# Patient Record
Sex: Male | Born: 1990 | Race: White | Hispanic: No | Marital: Married | Smoking: Former smoker
Health system: Southern US, Community
[De-identification: ages and names within clinical notes are randomized; demographics above are authoritative.]

## PROBLEM LIST (undated history)

## (undated) DIAGNOSIS — G43909 Migraine, unspecified, not intractable, without status migrainosus: Secondary | ICD-10-CM

## (undated) HISTORY — DX: Migraine, unspecified, not intractable, without status migrainosus: G43.909

---

## 2011-09-20 ENCOUNTER — Other Ambulatory Visit (HOSPITAL_COMMUNITY): Payer: Self-pay | Admitting: *Deleted

## 2011-09-20 DIAGNOSIS — S42413A Displaced simple supracondylar fracture without intercondylar fracture of unspecified humerus, initial encounter for closed fracture: Secondary | ICD-10-CM

## 2011-10-03 ENCOUNTER — Other Ambulatory Visit (HOSPITAL_COMMUNITY): Payer: Self-pay

## 2011-10-03 ENCOUNTER — Ambulatory Visit (HOSPITAL_COMMUNITY)
Admission: RE | Admit: 2011-10-03 | Discharge: 2011-10-03 | Disposition: A | Discharge status: Home / Self Care | Payer: BC Managed Care – PPO | Source: Ambulatory Visit | Attending: *Deleted | Admitting: *Deleted

## 2011-10-03 ENCOUNTER — Ambulatory Visit (HOSPITAL_COMMUNITY)
Admission: RE | Admit: 2011-10-03 | Discharge: 2011-10-03 | Disposition: A | Discharge status: Home / Self Care | Payer: BC Managed Care – PPO | Source: Ambulatory Visit | Attending: Orthopedic Surgery | Admitting: Orthopedic Surgery

## 2011-10-03 DIAGNOSIS — M249 Joint derangement, unspecified: Secondary | ICD-10-CM | POA: Insufficient documentation

## 2011-10-03 DIAGNOSIS — M25529 Pain in unspecified elbow: Secondary | ICD-10-CM | POA: Insufficient documentation

## 2011-10-03 DIAGNOSIS — S42413A Displaced simple supracondylar fracture without intercondylar fracture of unspecified humerus, initial encounter for closed fracture: Secondary | ICD-10-CM

## 2011-10-03 MED ORDER — IOHEXOL 180 MG/ML  SOLN
7.5000 mL | Freq: Once | INTRAMUSCULAR | Status: AC | PRN
  Administered 2011-10-03: 7.5 mL via INTRAVENOUS

## 2013-10-23 IMAGING — RF DG FLUORO GUIDE NDL PLC/BX
3 series · 3 of 3 positions shown · IV contrast (omnipaque)
Comparison: none

CLINICAL DATA: History of right elbow trauma with subsequent
operative fixation.  Patient with elbow pain and locking.

RIGHT ELBOW INJECTION UNDER FLUOROSCOPY
The procedure, risks, benefits and alternatives were explained to
the patient.  Questions regarding the procedure were encouraged and
answered.  Written consent was obtained.
TECHNIQUE: An appropriate skin entrance site was determined under
fluoroscopy.  The site was marked, prepped with Betadine, draped in
the usual sterile fashion.  Lidocaine was used for local
anesthesia.  Subsequently, a needle was advanced into the joint
between radial head and capitellum using a lateral approach under
intermittent fluoroscopy.  Approximately 10cc of a combination of
Omnipaque 180 and 1% xylocaine was injected. Fluoroscopy confirms
intra-articular injection.
Fluoroscopy Time: 0.07 minutes.

[Series 1: run · 1 of 1 slices shown (1 of 3)]
[im 1/1]
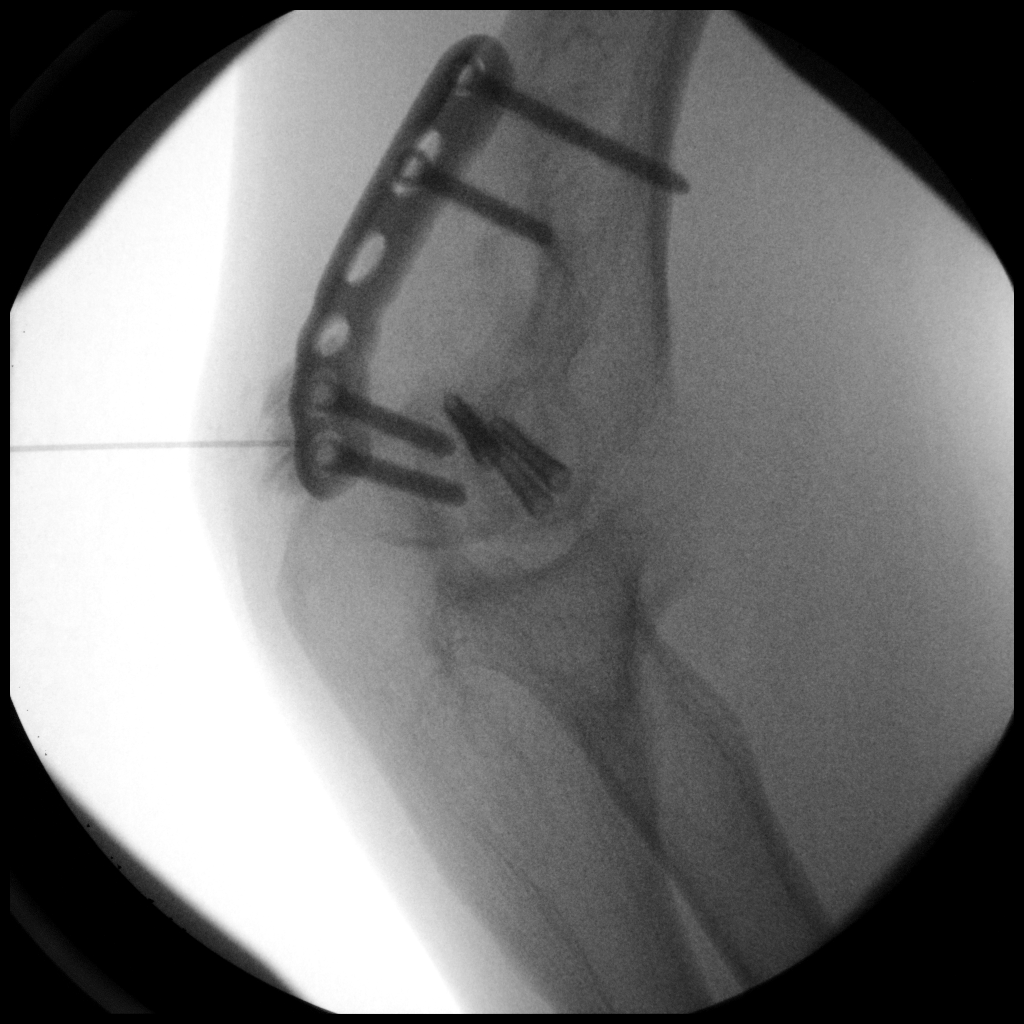

[Series 2: run · 1 of 1 slices shown (2 of 3)]
[im 1/1]
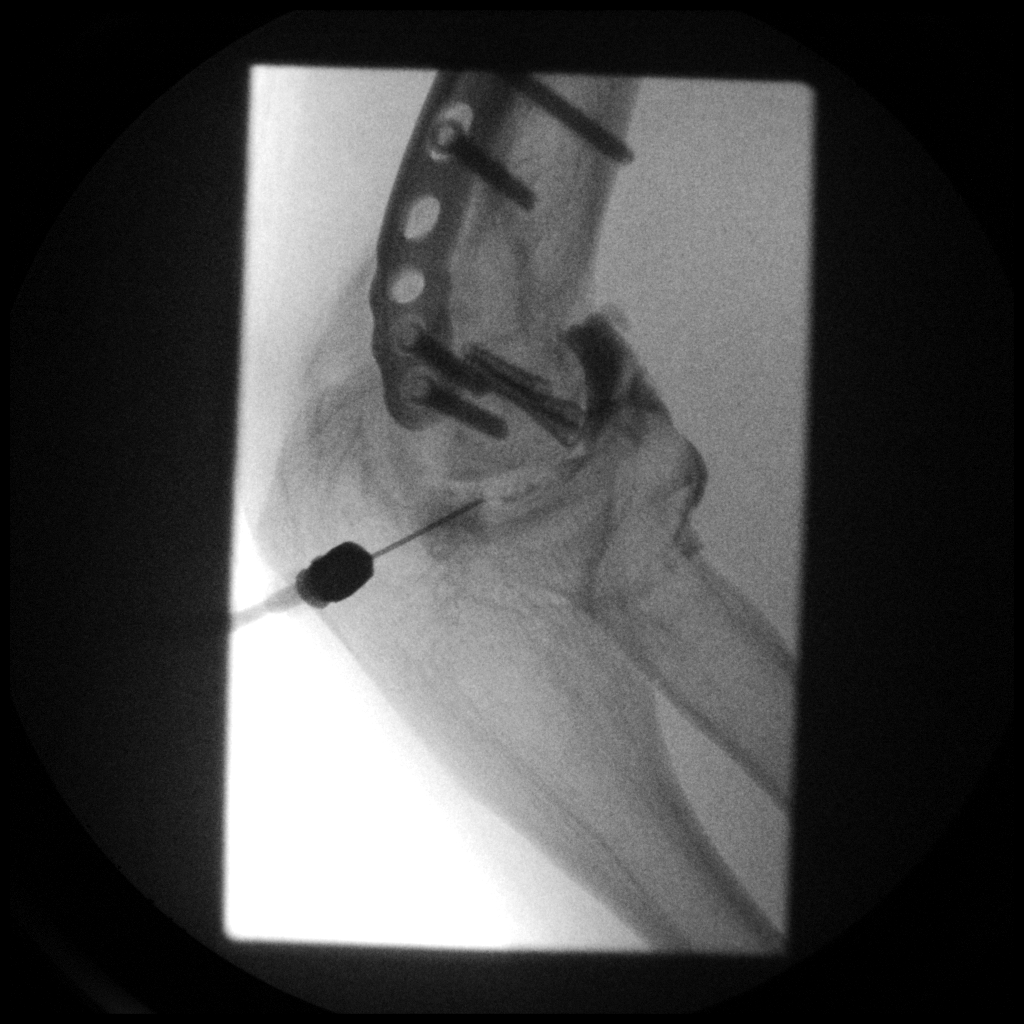

[Series 3: run · 1 of 1 slices shown (3 of 3)]
[im 1/1]
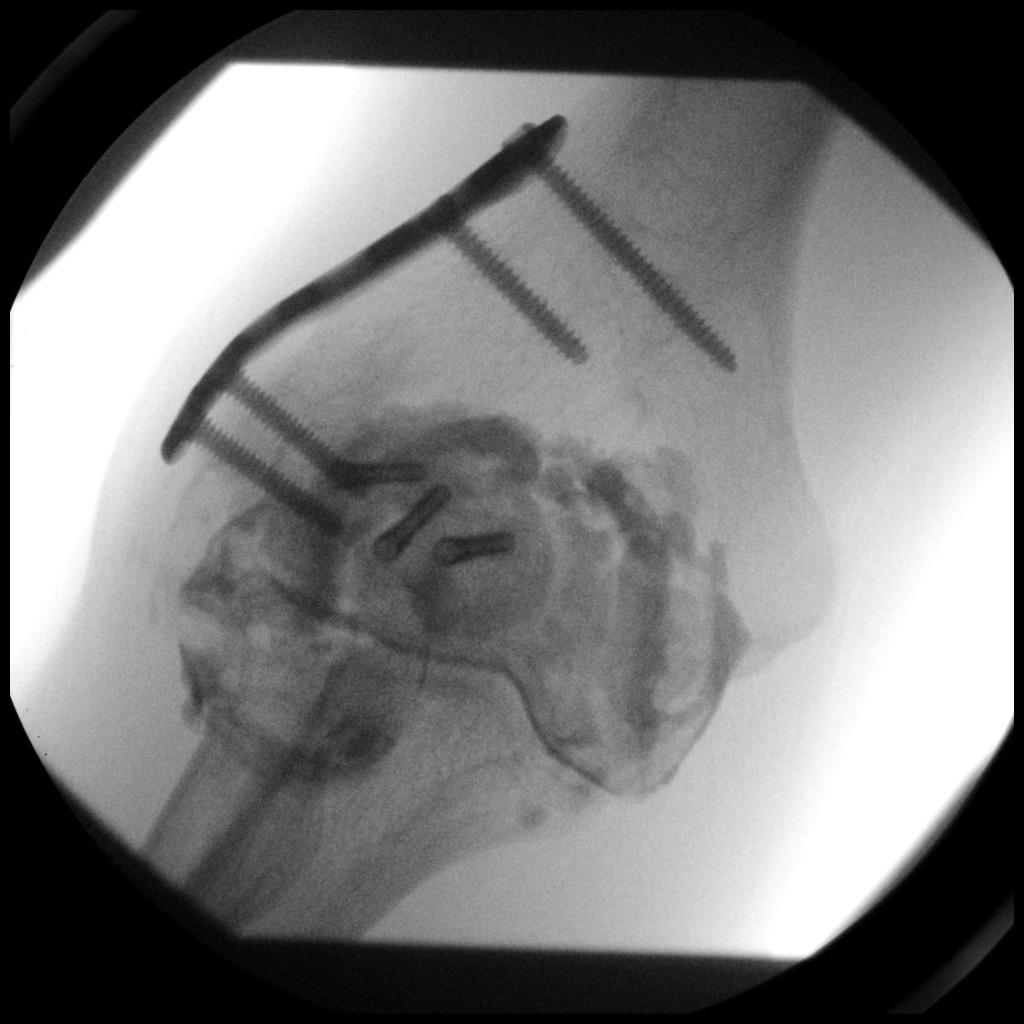

[3 of 3 positions shown; findings below may reference images not displayed]

IMPRESSION: Technically successful right elbow injection for subsequent CT
scan.

## 2013-10-23 IMAGING — CT CT ELBOW*R* W/CM
3 of 4 series · 16 of 33 positions shown, 18 images · IV contrast (agent unspecified)
Comparison: None

CLINICAL DATA: Prior trauma and fracture fixation.  Intermittent
pain and locking.

CT RIGHT ELBOW WITH CONTRAST
TECHNIQUE: Multidetector CT imaging of the right elbow was
performed according to the standard protocol following intra-
articular contrast administration. Multiplanar CT image
reconstructions were also generated.
Contrast:  See injection dictation for contrast amount.

[Series 4: extremityelbow 2.0 b31 · axial · 0.40mm/px · z∈[+31,+213]mm · 8 of 109 slices shown, 10 images]
[im 9/109  soft-tissue]
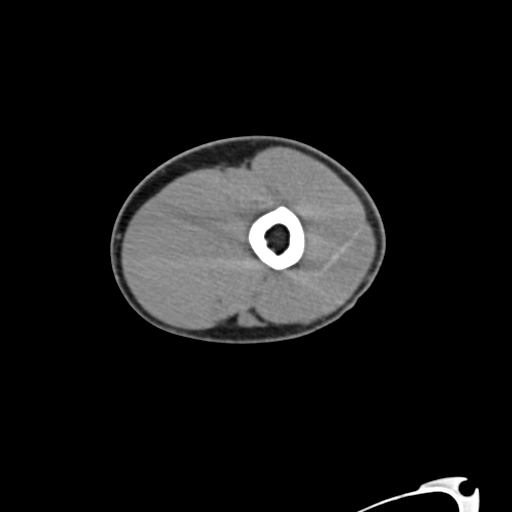
[im 9/109  bone]
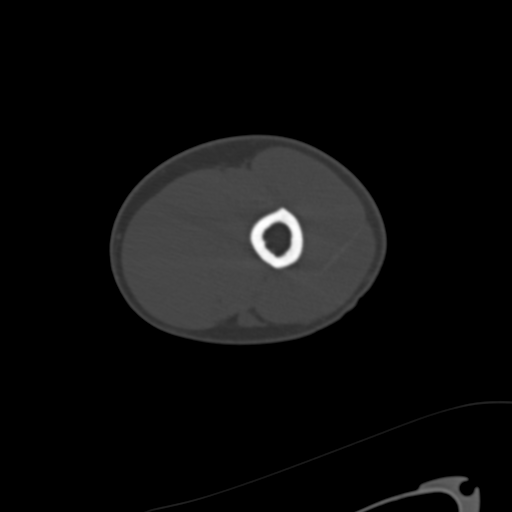
[im 25/109  bone]
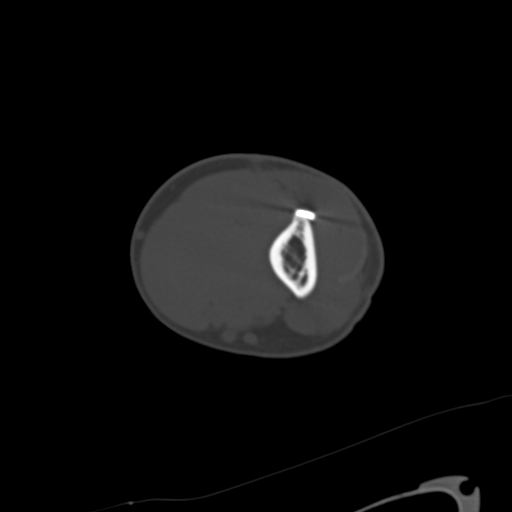
[im 34/109  bone]
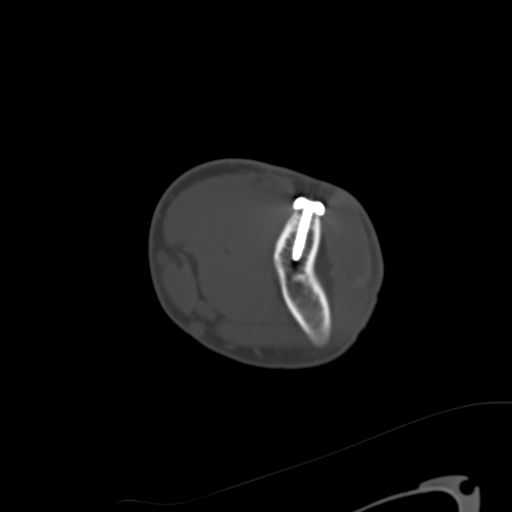
[im 50/109  bone]
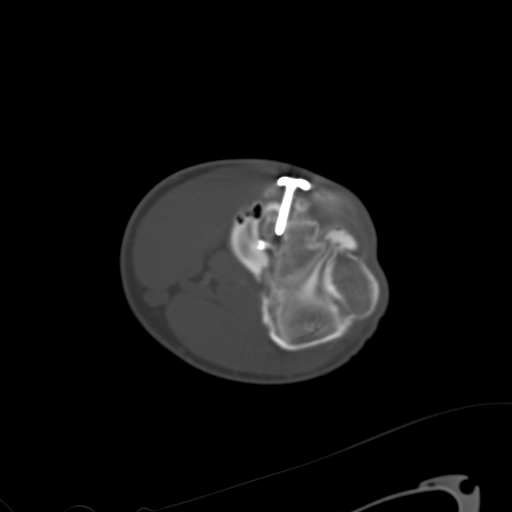
[im 59/109  soft-tissue]
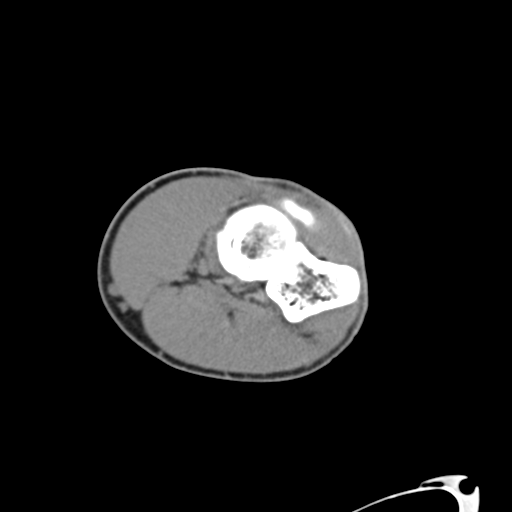
[im 59/109  bone]
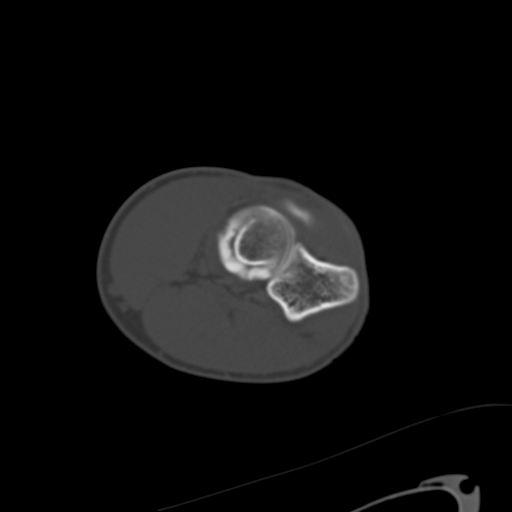
[im 75/109  bone]
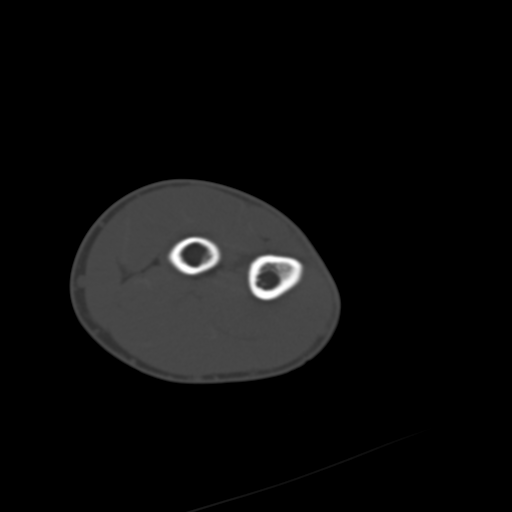
[im 84/109  bone]
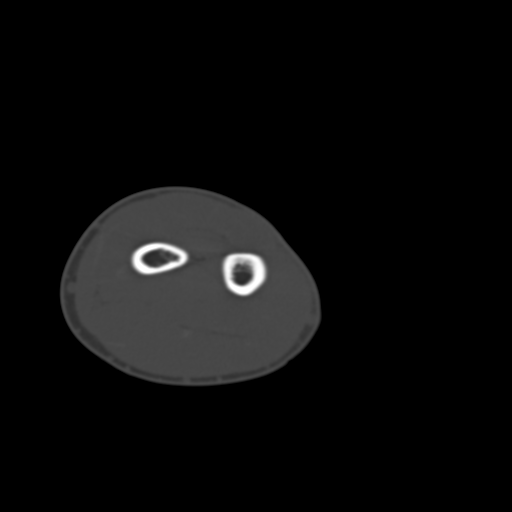
[im 100/109  bone]
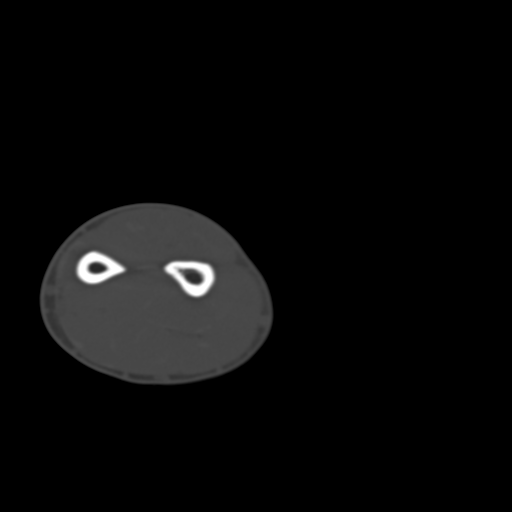

[Series 602: st cor · coronal · 0.43mm/px · 3 of 39 slices shown]
[im 8/39  bone]
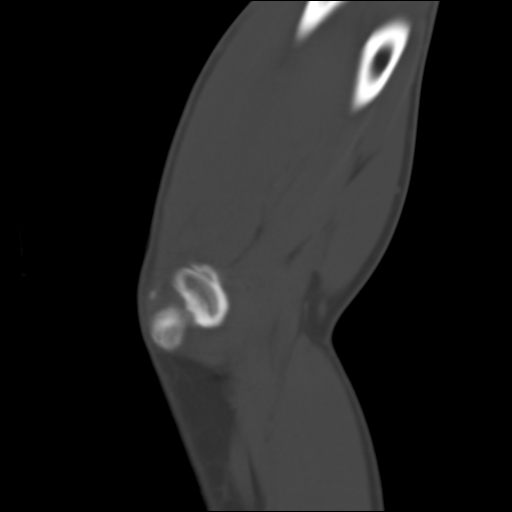
[im 16/39  bone]
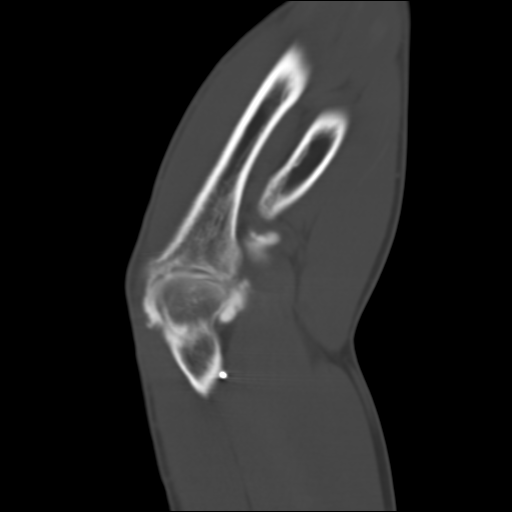
[im 23/39  bone]
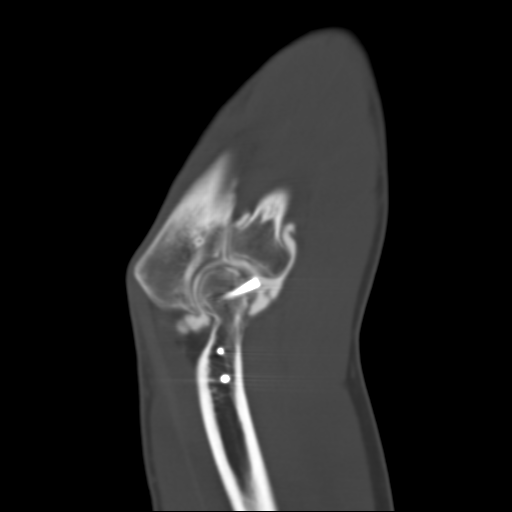

[Series 605: bone sag · sagittal · 0.43mm/px · 5 of 47 slices shown]
[im 7/47  bone]
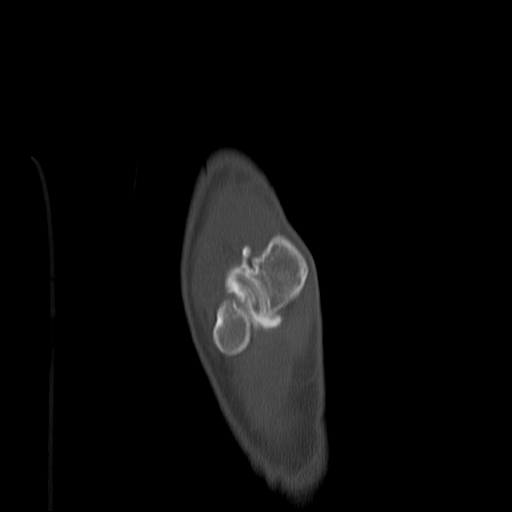
[im 14/47  bone]
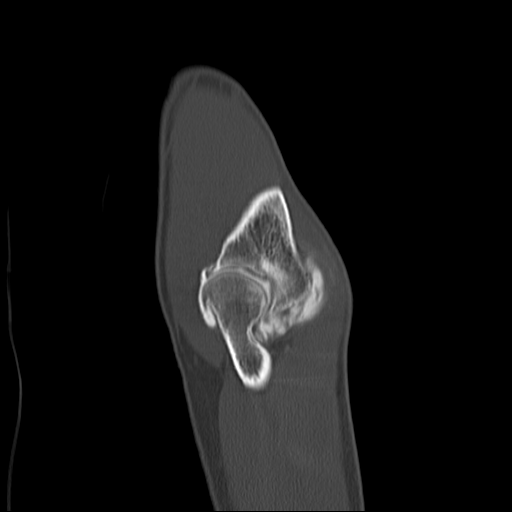
[im 20/47  bone]
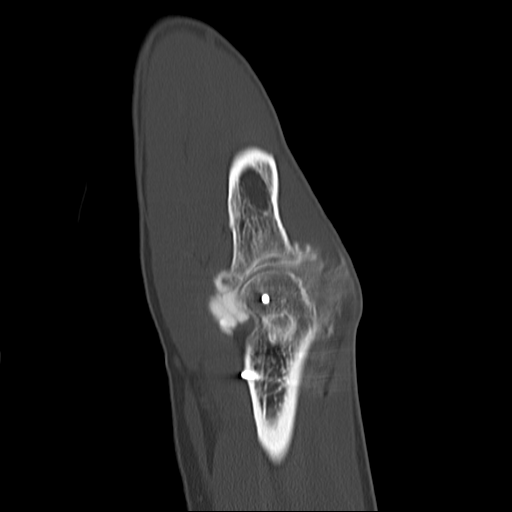
[im 27/47  bone]
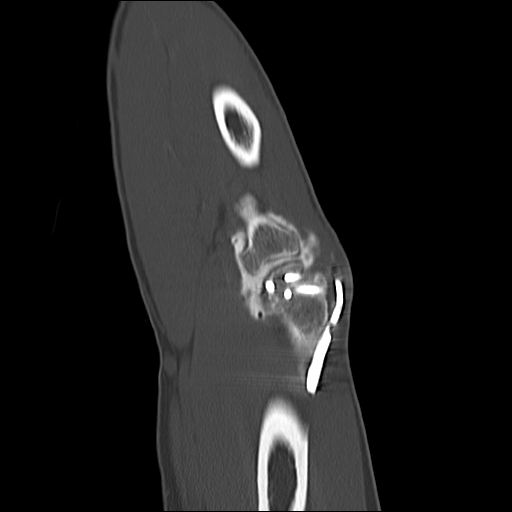
[im 33/47  bone]
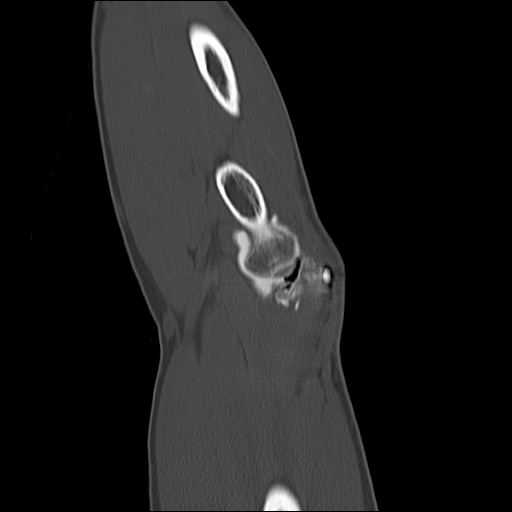

[16 of 33 positions shown; findings below may reference images not displayed]

FINDINGS: There is a lateral side plate on the distal humerus with
a healed distal humerus fracture.  The most inferior screw
demonstrates slight lucency around it suggesting loosening.

There are three bone anchors in the capitellum likely related to an
osteochondral fracture fixation.  The medial and middle bone
anchors are projecting into the radiocapitellar joint. The middle
anchor head abuts the articular cartilage of the radial head.

There are multiple small fracture fragments and areas of probable
heterotopic ossification around the elbow both medially and
laterally but no loose intra-articular bodies are demonstrated.

There are mild post-traumatic degenerative changes with areas of
spurring.
IMPRESSION: 1.  Lucency surrounding the lower most humeral screw consistent
with loosening.
2.  The medial and middle capitellar bone anchors are protruding
into the radiocapitellar joint (middle most pronounced).
3.  Mild post-traumatic degenerative changes.
4.  Multiple small loose fracture fragments and areas of
heterotopic ossification but no definite loose intra-articular
bodies.

## 2017-12-06 ENCOUNTER — Encounter: Payer: Self-pay | Admitting: Family Medicine

## 2017-12-06 ENCOUNTER — Ambulatory Visit: Payer: Commercial Managed Care - PPO | Admitting: Family Medicine

## 2017-12-06 VITALS — BP 112/80 | HR 66 | Temp 98.6°F | Ht 70.0 in | Wt 197.2 lb

## 2017-12-06 DIAGNOSIS — Z23 Encounter for immunization: Secondary | ICD-10-CM | POA: Diagnosis not present

## 2017-12-06 DIAGNOSIS — L989 Disorder of the skin and subcutaneous tissue, unspecified: Secondary | ICD-10-CM | POA: Insufficient documentation

## 2017-12-06 DIAGNOSIS — G43109 Migraine with aura, not intractable, without status migrainosus: Secondary | ICD-10-CM | POA: Insufficient documentation

## 2017-12-06 NOTE — Progress Notes (Signed)
Chief Complaint  Patient presents with  . New Patient (Initial Visit)       New Patient Visit SUBJECTIVE: HPI: Jack Allen is an 27 y.o.male who is being seen for establishing care.  The patient has not previously had a PCP.  Patient has a history of migraines.  Around 1 month ago, the patient had his first migraine in 10 years.  He gets an aura consisting of right arm numbness/tingling.  Because he could not get in here quickly, he ended up going to a neurologist who refilled his nasal actuation Imitrex.  He has not had a migraine since then and does not currently have a headache.  The day he had his headache, he had 6 diet colas and was particularly stressed.  He thinks this contributed to his migraine.  He also has a history of skin cancer on his L foot.  This was removed and managed in the past.  He noticed some lesions on his right forearm several weeks ago.  He does not think they are changing and are less dark than his initial skin cancer.  There is also a lesion on his back that someone at the urgent care told him to get checked out.  Both he and his wife were unaware of this.  He did have significant sun exposure when he was younger.  No Known Allergies  Past Medical History:  Diagnosis Date  . Migraine    History reviewed. No pertinent surgical history. Family History  Problem Relation Age of Onset  . Hyperlipidemia Father   . Hypertension Father    No Known Allergies  Current Outpatient Medications:  .  SUMAtriptan (IMITREX) 20 MG/ACT nasal spray, Place 20 mg into the nose every 2 (two) hours as needed for migraine or headache. May repeat in 2 hours if headache persists or recurs., Disp: , Rfl:    ROS Skin: As noted in HPI  Neuro: Denies current HA   OBJECTIVE: BP 112/80 (BP Location: Left Arm, Patient Position: Sitting, Cuff Size: Normal)   Pulse 66   Temp 98.6 F (37 C) (Oral)   Ht 5\' 10"  (1.778 m)   Wt 197 lb 4 oz (89.5 kg)   SpO2 98%   BMI 28.30 kg/m     Constitutional: -  VS reviewed -  Well developed, well nourished, appears stated age -  No apparent distress  Psychiatric: -  Oriented to person, place, and time -  Memory intact -  Affect and mood normal -  Fluent conversation, good eye contact -  Judgment and insight age appropriate  Eye: -  Conjunctivae clear, no discharge -  Pupils symmetric, round, reactive to light  ENMT: -  MMM    Pharynx moist, no exudate, no erythema  Neck: -  No gross swelling, no palpable masses -  Thyroid midline, not enlarged, mobile, no palpable masses  Cardiovascular: -  RRR -  No LE edema  Respiratory: -  Normal respiratory effort, no accessory muscle use, no retraction -  Breath sounds equal, no wheezes, no ronchi, no crackles  Neurological:  -  CN II - XII grossly intact -  DTR's equal and symmetric throughout -  Sensation grossly intact to light touch, equal bilaterally  Musculoskeletal: -  No clubbing, no cyanosis -  Gait normal  Skin: -  See below -  Warm and dry to palpation    R upper back    R dorsal forearm   ASSESSMENT/PLAN: Migraine with aura and without status  migrainosus, not intractable  Skin lesion  Need for tetanus booster - Plan: Tdap vaccine greater than or equal to 7yo IM  Okay to refill Imitrex when he needs more.  He does not need to see neurology unless he wishes.  He knows what his triggers are. For the skin lesions, will monitor.  Likely an atypical nevus on the back, the lesions in the forearm appear benign.  If there are any changes, he will let us know and we will remove them to be sent to the lab. Patient should return in 6 months. The patient voiced understanding and agreement to the plan.   Jilda Roche Traver, DO 12/06/17  12:23 PM

## 2017-12-06 NOTE — Progress Notes (Signed)
Pre visit review using our clinic review tool, if applicable. No additional management support is needed unless otherwise documented below in the visit note. 

## 2017-12-06 NOTE — Patient Instructions (Addendum)
Let me know if anything changes with the skin areas. Take a picture of them both. Have your wife keep an eye on the area on your back (right upper area).  Let me know when you need refills of the nasal spray.  Let us know if you need anything.

## 2018-02-20 DIAGNOSIS — M5136 Other intervertebral disc degeneration, lumbar region: Secondary | ICD-10-CM | POA: Diagnosis not present

## 2018-02-20 DIAGNOSIS — M5137 Other intervertebral disc degeneration, lumbosacral region: Secondary | ICD-10-CM | POA: Diagnosis not present

## 2018-02-20 DIAGNOSIS — M5431 Sciatica, right side: Secondary | ICD-10-CM | POA: Diagnosis not present

## 2018-03-01 DIAGNOSIS — G43109 Migraine with aura, not intractable, without status migrainosus: Secondary | ICD-10-CM | POA: Diagnosis not present

## 2018-03-20 DIAGNOSIS — M5137 Other intervertebral disc degeneration, lumbosacral region: Secondary | ICD-10-CM | POA: Diagnosis not present

## 2018-03-20 DIAGNOSIS — M5136 Other intervertebral disc degeneration, lumbar region: Secondary | ICD-10-CM | POA: Diagnosis not present

## 2018-03-20 DIAGNOSIS — M5431 Sciatica, right side: Secondary | ICD-10-CM | POA: Diagnosis not present

## 2018-04-02 DIAGNOSIS — M545 Low back pain: Secondary | ICD-10-CM | POA: Diagnosis not present

## 2018-04-05 ENCOUNTER — Ambulatory Visit: Payer: Commercial Managed Care - PPO | Admitting: Family Medicine

## 2018-04-06 ENCOUNTER — Encounter: Payer: Self-pay | Admitting: Family Medicine

## 2018-04-06 ENCOUNTER — Ambulatory Visit: Payer: Commercial Managed Care - PPO | Admitting: Family Medicine

## 2018-04-06 VITALS — BP 128/86 | HR 65 | Temp 98.3°F | Ht 70.0 in | Wt 209.1 lb

## 2018-04-06 DIAGNOSIS — M545 Low back pain, unspecified: Secondary | ICD-10-CM

## 2018-04-06 MED ORDER — MELOXICAM 15 MG PO TABS: 15.0000 mg | ORAL_TABLET | Freq: Every day | ORAL | 0 refills | Status: DC | PRN

## 2018-04-06 NOTE — Patient Instructions (Signed)
Send me a message in 3-4 weeks if no improvement with the stretches/exercises.  Heat (pad or rice pillow in microwave) over affected area, 10-15 minutes twice daily.   OK to take Tylenol 1000 mg (2 extra strength tabs) or 975 mg (3 regular strength tabs) every 6 hours as needed.  EXERCISES  RANGE OF MOTION (ROM) AND STRETCHING EXERCISES - Low Back Pain Most people with lower back pain will find that their symptoms get worse with excessive bending forward (flexion) or arching at the lower back (extension). The exercises that will help resolve your symptoms will focus on the opposite motion.  If you have pain, numbness or tingling which travels down into your buttocks, leg or foot, the goal of the therapy is for these symptoms to move closer to your back and eventually resolve. Sometimes, these leg symptoms will get better, but your lower back pain may worsen. This is often an indication of progress in your rehabilitation. Be very alert to any changes in your symptoms and the activities in which you participated in the 24 hours prior to the change. Sharing this information with your caregiver will allow him or her to most efficiently treat your condition. These exercises may help you when beginning to rehabilitate your injury. Your symptoms may resolve with or without further involvement from your physician, physical therapist or athletic trainer. While completing these exercises, remember:   Restoring tissue flexibility helps normal motion to return to the joints. This allows healthier, less painful movement and activity.  An effective stretch should be held for at least 30 seconds.  A stretch should never be painful. You should only feel a gentle lengthening or release in the stretched tissue. FLEXION RANGE OF MOTION AND STRETCHING EXERCISES:  STRETCH - Flexion, Single Knee to Chest   Lie on a firm bed or floor with both legs extended in front of you.  Keeping one leg in contact with the  floor, bring your opposite knee to your chest. Hold your leg in place by either grabbing behind your thigh or at your knee.  Pull until you feel a gentle stretch in your low back. Hold 30 seconds.  Slowly release your grasp and repeat the exercise with the opposite side. Repeat 2 times. Complete this exercise 3 times per week.   STRETCH - Flexion, Double Knee to Chest  Lie on a firm bed or floor with both legs extended in front of you.  Keeping one leg in contact with the floor, bring your opposite knee to your chest.  Tense your stomach muscles to support your back and then lift your other knee to your chest. Hold your legs in place by either grabbing behind your thighs or at your knees.  Pull both knees toward your chest until you feel a gentle stretch in your low back. Hold 30 seconds.  Tense your stomach muscles and slowly return one leg at a time to the floor. Repeat 2 times. Complete this exercise 3 times per week.   STRETCH - Low Trunk Rotation  Lie on a firm bed or floor. Keeping your legs in front of you, bend your knees so they are both pointed toward the ceiling and your feet are flat on the floor.  Extend your arms out to the side. This will stabilize your upper body by keeping your shoulders in contact with the floor.  Gently and slowly drop both knees together to one side until you feel a gentle stretch in your low back. Hold for  30 seconds.  Tense your stomach muscles to support your lower back as you bring your knees back to the starting position. Repeat the exercise to the other side. Repeat 2 times. Complete this exercise at least 3 times per week.   EXTENSION RANGE OF MOTION AND FLEXIBILITY EXERCISES:  STRETCH - Extension, Prone on Elbows   Lie on your stomach on the floor, a bed will be too soft. Place your palms about shoulder width apart and at the height of your head.  Place your elbows under your shoulders. If this is too painful, stack pillows under your  chest.  Allow your body to relax so that your hips drop lower and make contact more completely with the floor.  Hold this position for 30 seconds.  Slowly return to lying flat on the floor. Repeat 2 times. Complete this exercise 3 times per week.   RANGE OF MOTION - Extension, Prone Press Ups  Lie on your stomach on the floor, a bed will be too soft. Place your palms about shoulder width apart and at the height of your head.  Keeping your back as relaxed as possible, slowly straighten your elbows while keeping your hips on the floor. You may adjust the placement of your hands to maximize your comfort. As you gain motion, your hands will come more underneath your shoulders.  Hold this position 30 seconds.  Slowly return to lying flat on the floor. Repeat 2 times. Complete this exercise 3 times per week.   RANGE OF MOTION- Quadruped, Neutral Spine   Assume a hands and knees position on a firm surface. Keep your hands under your shoulders and your knees under your hips. You may place padding under your knees for comfort.  Drop your head and point your tailbone toward the ground below you. This will round out your lower back like an angry cat. Hold this position for 30 seconds.  Slowly lift your head and release your tail bone so that your back sags into a large arch, like an old horse.  Hold this position for 30 seconds.  Repeat this until you feel limber in your low back.  Now, find your "sweet spot." This will be the most comfortable position somewhere between the two previous positions. This is your neutral spine. Once you have found this position, tense your stomach muscles to support your low back.  Hold this position for 30 seconds. Repeat 2 times. Complete this exercise 3 times per week.   STRENGTHENING EXERCISES - Low Back Sprain These exercises may help you when beginning to rehabilitate your injury. These exercises should be done near your "sweet spot." This is the  neutral, low-back arch, somewhere between fully rounded and fully arched, that is your least painful position. When performed in this safe range of motion, these exercises can be used for people who have either a flexion or extension based injury. These exercises may resolve your symptoms with or without further involvement from your physician, physical therapist or athletic trainer. While completing these exercises, remember:   Muscles can gain both the endurance and the strength needed for everyday activities through controlled exercises.  Complete these exercises as instructed by your physician, physical therapist or athletic trainer. Increase the resistance and repetitions only as guided.  You may experience muscle soreness or fatigue, but the pain or discomfort you are trying to eliminate should never worsen during these exercises. If this pain does worsen, stop and make certain you are following the directions exactly. If  the pain is still present after adjustments, discontinue the exercise until you can discuss the trouble with your caregiver.  STRENGTHENING - Deep Abdominals, Pelvic Tilt   Lie on a firm bed or floor. Keeping your legs in front of you, bend your knees so they are both pointed toward the ceiling and your feet are flat on the floor.  Tense your lower abdominal muscles to press your low back into the floor. This motion will rotate your pelvis so that your tail bone is scooping upwards rather than pointing at your feet or into the floor. With a gentle tension and even breathing, hold this position for 3 seconds. Repeat 2 times. Complete this exercise 3 times per week.   STRENGTHENING - Abdominals, Crunches   Lie on a firm bed or floor. Keeping your legs in front of you, bend your knees so they are both pointed toward the ceiling and your feet are flat on the floor. Cross your arms over your chest.  Slightly tip your chin down without bending your neck.  Tense your abdominals  and slowly lift your trunk high enough to just clear your shoulder blades. Lifting higher can put excessive stress on the lower back and does not further strengthen your abdominal muscles.  Control your return to the starting position. Repeat 2 times. Complete this exercise 3 times per week.   STRENGTHENING - Quadruped, Opposite UE/LE Lift   Assume a hands and knees position on a firm surface. Keep your hands under your shoulders and your knees under your hips. You may place padding under your knees for comfort.  Find your neutral spine and gently tense your abdominal muscles so that you can maintain this position. Your shoulders and hips should form a rectangle that is parallel with the floor and is not twisted.  Keeping your trunk steady, lift your right hand no higher than your shoulder and then your left leg no higher than your hip. Make sure you are not holding your breath. Hold this position for 30 seconds.  Continuing to keep your abdominal muscles tense and your back steady, slowly return to your starting position. Repeat with the opposite arm and leg. Repeat 2 times. Complete this exercise 3 times per week.   STRENGTHENING - Abdominals and Quadriceps, Straight Leg Raise   Lie on a firm bed or floor with both legs extended in front of you.  Keeping one leg in contact with the floor, bend the other knee so that your foot can rest flat on the floor.  Find your neutral spine, and tense your abdominal muscles to maintain your spinal position throughout the exercise.  Slowly lift your straight leg off the floor about 6 inches for a count of 3, making sure to not hold your breath.  Still keeping your neutral spine, slowly lower your leg all the way to the floor. Repeat this exercise with each leg 2 times. Complete this exercise 3 times per week.  POSTURE AND BODY MECHANICS CONSIDERATIONS - Low Back Sprain Keeping correct posture when sitting, standing or completing your activities  will reduce the stress put on different body tissues, allowing injured tissues a chance to heal and limiting painful experiences. The following are general guidelines for improved posture.  While reading these guidelines, remember:  The exercises prescribed by your provider will help you have the flexibility and strength to maintain correct postures.  The correct posture provides the best environment for your joints to work. All of your joints have less wear  and tear when properly supported by a spine with good posture. This means you will experience a healthier, less painful body.  Correct posture must be practiced with all of your activities, especially prolonged sitting and standing. Correct posture is as important when doing repetitive low-stress activities (typing) as it is when doing a single heavy-load activity (lifting).  RESTING POSITIONS Consider which positions are most painful for you when choosing a resting position. If you have pain with flexion-based activities (sitting, bending, stooping, squatting), choose a position that allows you to rest in a less flexed posture. You would want to avoid curling into a fetal position on your side. If your pain worsens with extension-based activities (prolonged standing, working overhead), avoid resting in an extended position such as sleeping on your stomach. Most people will find more comfort when they rest with their spine in a more neutral position, neither too rounded nor too arched. Lying on a non-sagging bed on your side with a pillow between your knees, or on your back with a pillow under your knees will often provide some relief. Keep in mind, being in any one position for a prolonged period of time, no matter how correct your posture, can still lead to stiffness.  PROPER SITTING POSTURE In order to minimize stress and discomfort on your spine, you must sit with correct posture. Sitting with good posture should be effortless for a healthy body.  Returning to good posture is a gradual process. Many people can work toward this most comfortably by using various supports until they have the flexibility and strength to maintain this posture on their own. When sitting with proper posture, your ears will fall over your shoulders and your shoulders will fall over your hips. You should use the back of the chair to support your upper back. Your lower back will be in a neutral position, just slightly arched. You may place a small pillow or folded towel at the base of your lower back for  support.  When working at a desk, create an environment that supports good, upright posture. Without extra support, muscles tire, which leads to excessive strain on joints and other tissues. Keep these recommendations in mind:  CHAIR:  A chair should be able to slide under your desk when your back makes contact with the back of the chair. This allows you to work closely.  The chair's height should allow your eyes to be level with the upper part of your monitor and your hands to be slightly lower than your elbows.  BODY POSITION  Your feet should make contact with the floor. If this is not possible, use a foot rest.  Keep your ears over your shoulders. This will reduce stress on your neck and low back.  INCORRECT SITTING POSTURES  If you are feeling tired and unable to assume a healthy sitting posture, do not slouch or slump. This puts excessive strain on your back tissues, causing more damage and pain. Healthier options include:  Using more support, like a lumbar pillow.  Switching tasks to something that requires you to be upright or walking.  Talking a brief walk.  Lying down to rest in a neutral-spine position.  PROLONGED STANDING WHILE SLIGHTLY LEANING FORWARD  When completing a task that requires you to lean forward while standing in one place for a long time, place either foot up on a stationary 2-4 inch high object to help maintain the best posture.  When both feet are on the ground, the lower back  tends to lose its slight inward curve. If this curve flattens (or becomes too large), then the back and your other joints will experience too much stress, tire more quickly, and can cause pain.  CORRECT STANDING POSTURES Proper standing posture should be assumed with all daily activities, even if they only take a few moments, like when brushing your teeth. As in sitting, your ears should fall over your shoulders and your shoulders should fall over your hips. You should keep a slight tension in your abdominal muscles to brace your spine. Your tailbone should point down to the ground, not behind your body, resulting in an over-extended swayback posture.   INCORRECT STANDING POSTURES  Common incorrect standing postures include a forward head, locked knees and/or an excessive swayback. WALKING Walk with an upright posture. Your ears, shoulders and hips should all line-up.  PROLONGED ACTIVITY IN A FLEXED POSITION When completing a task that requires you to bend forward at your waist or lean over a low surface, try to find a way to stabilize 3 out of 4 of your limbs. You can place a hand or elbow on your thigh or rest a knee on the surface you are reaching across. This will provide you more stability, so that your muscles do not tire as quickly. By keeping your knees relaxed, or slightly bent, you will also reduce stress across your lower back. CORRECT LIFTING TECHNIQUES  DO :  Assume a wide stance. This will provide you more stability and the opportunity to get as close as possible to the object which you are lifting.  Tense your abdominals to brace your spine. Bend at the knees and hips. Keeping your back locked in a neutral-spine position, lift using your leg muscles. Lift with your legs, keeping your back straight.  Test the weight of unknown objects before attempting to lift them.  Try to keep your elbows locked down at your sides in order get the  best strength from your shoulders when carrying an object.     Always ask for help when lifting heavy or awkward objects. INCORRECT LIFTING TECHNIQUES DO NOT:   Lock your knees when lifting, even if it is a small object.  Bend and twist. Pivot at your feet or move your feet when needing to change directions.  Assume that you can safely pick up even a paperclip without proper posture.

## 2018-04-06 NOTE — Progress Notes (Signed)
Musculoskeletal Exam  Patient: Jack Allen DOB: 08-05-1990  DOS: 04/06/2018  SUBJECTIVE:  Chief Complaint:   Chief Complaint  Patient presents with  . Back Pain    Jack Allen is a 28 y.o.  male for evaluation and treatment of his back pain.   Onset:  9 days ago.  Recently injured again squatting a few weeks ago.  Location: lower left Character:  aching and sharp  Progression of issue:  has improved Associated symptoms: pain with movements Denies bowel/bladder incontinence or weakness Treatment: to date has been medrol dosepak that helped, stretches/exercises.   Neurovascular symptoms: no  ROS: Musculoskeletal/Extremities: +back pain Neurologic: no numbness, tingling no weakness   Past Medical History:  Diagnosis Date  . Migraine     Objective:  VITAL SIGNS: BP 128/86 (BP Location: Left Arm, Patient Position: Sitting, Cuff Size: Normal)   Pulse 65   Temp 98.3 F (36.8 C) (Oral)   Ht 5\' 10"  (1.778 m)   Wt 209 lb 2 oz (94.9 kg)   SpO2 97%   BMI 30.01 kg/m  Constitutional: Well formed, well developed. No acute distress. HENT: Normocephalic, atraumatic.  Thorax & Lungs:  No accessory muscle use Extremities: No clubbing. No cyanosis. No edema.  Skin: Warm. Dry. No erythema. No rash.  Musculoskeletal: low back.   Tenderness to palpation: no Deformity: no Ecchymosis: no Straight leg test: negative for Poor hamstring flexibility b/l. Neurologic: Normal sensory function. No focal deficits noted. DTR's equal and symmetry in LE's. 5/5 strength throughout LE's b/l. No clonus. Psychiatric: Normal mood. Age appropriate judgment and insight. Alert & oriented x 3.    Assessment:  Acute left-sided low back pain without sciatica - Plan: meloxicam (MOBIC) 15 MG tablet  Plan: Mobic prn when the steroid runs out tomorrow.  No red flag s/s's. Discussed likely ins requirements for MRI in absence of red flag s/s's.  Stretches/exercises, heat, ice, Tylenol. Let me  know via MyChart in 3-4 weeks if no improvement, will get set up with PT and sched f/u for 6 weeks aftewards. The patient voiced understanding and agreement to the plan.   Jilda Roche [place], DO 04/06/18  3:25 PM

## 2018-04-15 DIAGNOSIS — E8881 Metabolic syndrome: Secondary | ICD-10-CM | POA: Diagnosis not present

## 2018-05-04 ENCOUNTER — Other Ambulatory Visit: Payer: Self-pay | Admitting: Family Medicine

## 2018-05-04 DIAGNOSIS — M545 Low back pain, unspecified: Secondary | ICD-10-CM

## 2018-05-08 DIAGNOSIS — M5136 Other intervertebral disc degeneration, lumbar region: Secondary | ICD-10-CM | POA: Diagnosis not present

## 2018-05-08 DIAGNOSIS — M5431 Sciatica, right side: Secondary | ICD-10-CM | POA: Diagnosis not present

## 2018-05-08 DIAGNOSIS — M5137 Other intervertebral disc degeneration, lumbosacral region: Secondary | ICD-10-CM | POA: Diagnosis not present

## 2018-06-12 DIAGNOSIS — M5137 Other intervertebral disc degeneration, lumbosacral region: Secondary | ICD-10-CM | POA: Diagnosis not present

## 2018-06-12 DIAGNOSIS — M5136 Other intervertebral disc degeneration, lumbar region: Secondary | ICD-10-CM | POA: Diagnosis not present

## 2018-06-12 DIAGNOSIS — M5431 Sciatica, right side: Secondary | ICD-10-CM | POA: Diagnosis not present

## 2018-06-14 ENCOUNTER — Encounter: Payer: Commercial Managed Care - PPO | Admitting: Family Medicine

## 2018-06-14 ENCOUNTER — Other Ambulatory Visit: Payer: Self-pay | Admitting: Family Medicine

## 2018-06-14 DIAGNOSIS — M545 Low back pain, unspecified: Secondary | ICD-10-CM

## 2018-09-18 ENCOUNTER — Other Ambulatory Visit: Payer: Self-pay | Admitting: Family Medicine

## 2018-09-18 DIAGNOSIS — M545 Low back pain, unspecified: Secondary | ICD-10-CM

## 2018-10-22 ENCOUNTER — Other Ambulatory Visit: Payer: Self-pay | Admitting: Family Medicine

## 2018-10-22 DIAGNOSIS — M545 Low back pain, unspecified: Secondary | ICD-10-CM

## 2018-11-18 ENCOUNTER — Other Ambulatory Visit: Payer: Self-pay | Admitting: Family Medicine

## 2018-11-18 DIAGNOSIS — M545 Low back pain, unspecified: Secondary | ICD-10-CM

## 2019-02-06 ENCOUNTER — Other Ambulatory Visit: Payer: Self-pay | Admitting: Family Medicine

## 2019-02-06 DIAGNOSIS — M545 Low back pain, unspecified: Secondary | ICD-10-CM

## 2019-03-14 ENCOUNTER — Other Ambulatory Visit: Payer: Self-pay | Admitting: Family Medicine

## 2019-03-14 DIAGNOSIS — M545 Low back pain, unspecified: Secondary | ICD-10-CM

## 2019-04-28 ENCOUNTER — Other Ambulatory Visit: Payer: Self-pay | Admitting: Family Medicine

## 2019-04-28 DIAGNOSIS — M545 Low back pain, unspecified: Secondary | ICD-10-CM

## 2019-06-04 ENCOUNTER — Other Ambulatory Visit: Payer: Self-pay

## 2019-06-05 ENCOUNTER — Other Ambulatory Visit: Payer: Self-pay

## 2019-06-05 ENCOUNTER — Encounter: Payer: Self-pay | Admitting: Family Medicine

## 2019-06-05 ENCOUNTER — Ambulatory Visit (INDEPENDENT_AMBULATORY_CARE_PROVIDER_SITE_OTHER): Payer: Commercial Managed Care - PPO | Admitting: Family Medicine

## 2019-06-05 VITALS — BP 122/84 | HR 62 | Temp 97.0°F | Ht 70.0 in | Wt 224.4 lb

## 2019-06-05 DIAGNOSIS — R42 Dizziness and giddiness: Secondary | ICD-10-CM | POA: Diagnosis not present

## 2019-06-05 DIAGNOSIS — M545 Low back pain, unspecified: Secondary | ICD-10-CM

## 2019-06-05 DIAGNOSIS — H6982 Other specified disorders of Eustachian tube, left ear: Secondary | ICD-10-CM

## 2019-06-05 MED ORDER — MELOXICAM 15 MG PO TABS: ORAL_TABLET | ORAL | 2 refills | Status: DC

## 2019-06-05 MED ORDER — FLUTICASONE PROPIONATE 50 MCG/ACT NA SUSP: 2.0000 | Freq: Every day | NASAL | 6 refills | Status: AC

## 2019-06-05 NOTE — Progress Notes (Signed)
Chief Complaint  Patient presents with  . Dizziness    Jack Allen is 29 y.o. pt here for dizziness.  Duration: 1 month; off and on, getting more freq Used to do the Epley maneuver that helped but less so this time He does not currently have any issues Pass out? No Spinning? Yes Recent illness/fever? No Headache? No Neurologic signs? No Change in PO intake? No  Patient will have intermittent back pain.  He is wondering if he can continue taking Mobic intermittently.  He takes on average 1-2 times per week.  Past Medical History:  Diagnosis Date  . Migraine     Family History  Problem Relation Age of Onset  . Hyperlipidemia Father   . Hypertension Father     BP 122/84 (BP Location: Left Arm, Patient Position: Sitting, Cuff Size: Normal)   Pulse 62   Temp (!) 97 F (36.1 C) (Temporal)   Ht 5\' 10"  (1.778 m)   Wt 224 lb 6 oz (101.8 kg)   SpO2 97%   BMI 32.19 kg/m  General: Awake, alert, appears stated age Eyes: PERRLA, EOMi Ears: Patent, TM's neg on the right, slightly retracted on the left; no fluid or purulence, no erythema. Lungs: no accessory muscle use MSK: 5/5 strength throughout, gait normal Neuro: No cerebellar signs Dix-Hall-Pike negative bilaterally. Psych: Age appropriate judgment and insight, normal mood and affect  Vertigo  Dysfunction of left eustachian tube - Plan: fluticasone (FLONASE) 50 MCG/ACT nasal spray  Acute left-sided low back pain without sciatica - Plan: meloxicam (MOBIC) 15 MG tablet  1-watchful waiting, if it happens again I will refer him to the vestibular rehab team. 2-Flonase, can add an oral antihistamine 3-Mobic as needed, continue stretching exercises. F/u for physical at his convenience. Pt voiced understanding and agreement to the plan.  [place], DO 06/05/19 4:48 PM

## 2019-06-05 NOTE — Patient Instructions (Addendum)
Let me know if you have issues in the future with vertigo. We can set you up with the vestibular rehab team.   Claritin (loratadine), Allegra (fexofenadine), Zyrtec (cetirizine) which is also equivalent to Xyzal (levocetirizine); these are listed in order from weakest to strongest. Generic, and therefore cheaper, options are in the parentheses.   Flonase (fluticasone); nasal spray that is over the counter. 2 sprays each nostril, once daily. Aim towards the same side eye when you spray.  There are available OTC, and the generic versions, which may be cheaper, are in parentheses. Show this to a pharmacist if you have trouble finding any of these items.  OK to use meloxicam as needed. Don't use it every day.   Let us know if you need anything.

## 2019-10-20 ENCOUNTER — Other Ambulatory Visit: Payer: Self-pay | Admitting: Family Medicine

## 2019-10-20 DIAGNOSIS — M545 Low back pain, unspecified: Secondary | ICD-10-CM

## 2020-06-09 ENCOUNTER — Ambulatory Visit: Payer: Commercial Managed Care - PPO | Admitting: Family Medicine

## 2020-12-23 ENCOUNTER — Ambulatory Visit: Payer: Commercial Managed Care - PPO | Admitting: Family Medicine

## 2020-12-25 ENCOUNTER — Encounter: Payer: Self-pay | Admitting: Family Medicine

## 2020-12-25 ENCOUNTER — Ambulatory Visit (INDEPENDENT_AMBULATORY_CARE_PROVIDER_SITE_OTHER): Payer: Commercial Managed Care - PPO | Admitting: Family Medicine

## 2020-12-25 ENCOUNTER — Other Ambulatory Visit: Payer: Self-pay

## 2020-12-25 VITALS — BP 134/82 | HR 94 | Temp 98.2°F | Ht 70.0 in | Wt 234.2 lb

## 2020-12-25 DIAGNOSIS — M5441 Lumbago with sciatica, right side: Secondary | ICD-10-CM

## 2020-12-25 DIAGNOSIS — G8929 Other chronic pain: Secondary | ICD-10-CM | POA: Diagnosis not present

## 2020-12-25 MED ORDER — MELOXICAM 15 MG PO TABS: 15.0000 mg | ORAL_TABLET | Freq: Every day | ORAL | 0 refills | Status: DC

## 2020-12-25 NOTE — Progress Notes (Signed)
Musculoskeletal Exam  Patient: Jack Allen DOB: 1990-08-30  DOS: 12/25/2020  SUBJECTIVE:  Chief Complaint:   Chief Complaint  Patient presents with   Back Pain    Jack Allen is a 30 y.o.  male for evaluation and treatment of back pain.   Onset:  3 years, worsened over last 1 month ago.  Cycling more.  Location: lower Character:  aching  Progression of issue:  has worsened Associated symptoms: No bruising, redness, or swelling; having sharp pain on top of foot when it flares.  Denies bowel/bladder incontinence or weakness Treatment: to date has been rest, OTC NSAIDS, Physical therapy, and home exercises.   Neurovascular symptoms: no  Past Medical History:  Diagnosis Date   Migraine     Objective:  VITAL SIGNS: BP 134/82   Pulse 94   Temp 98.2 F (36.8 C) (Oral)   Ht 5\' 10"  (1.778 m)   Wt 234 lb 4 oz (106.3 kg)   SpO2 99%   BMI 33.61 kg/m  Constitutional: Well formed, well developed. No acute distress. HENT: Normocephalic, atraumatic.  Thorax & Lungs:  No accessory muscle use Musculoskeletal: low back.   Tenderness to palpation: yes in R lumbar parasp region Deformity: no Ecchymosis: no Straight leg test:  + on R Poor hamstring flexibility b/l. Neurologic: Normal sensory function. No focal deficits noted. DTR's equal and symmetric in LE's. No clonus. Psychiatric: Normal mood. Age appropriate judgment and insight. Alert & oriented x 3.    Assessment:  Chronic right-sided low back pain with right-sided sciatica - Plan: meloxicam (MOBIC) 15 MG tablet  Plan: Chronic, unstable. Stretches/exercises, heat, ice, Tylenol, NSAIDs. Will ck MRI if ortho doesn't.  F/u prn. The patient voiced understanding and agreement to the plan.   [place], DO 12/25/20  10:43 AM

## 2020-12-25 NOTE — Patient Instructions (Signed)
Let me know if they don't order an MRI on 11/22.   Heat (pad or rice pillow in microwave) over affected area, 10-15 minutes twice daily.   Ice/cold pack over area for 10-15 min twice daily.  OK to take Tylenol 1000 mg (2 extra strength tabs) or 975 mg (3 regular strength tabs) every 6 hours as needed.  Let us know if you need anything.  EXERCISES  RANGE OF MOTION (ROM) AND STRETCHING EXERCISES - Low Back Pain Most people with lower back pain will find that their symptoms get worse with excessive bending forward (flexion) or arching at the lower back (extension). The exercises that will help resolve your symptoms will focus on the opposite motion.  If you have pain, numbness or tingling which travels down into your buttocks, leg or foot, the goal of the therapy is for these symptoms to move closer to your back and eventually resolve. Sometimes, these leg symptoms will get better, but your lower back pain may worsen. This is often an indication of progress in your rehabilitation. Be very alert to any changes in your symptoms and the activities in which you participated in the 24 hours prior to the change. Sharing this information with your caregiver will allow him or her to most efficiently treat your condition. These exercises may help you when beginning to rehabilitate your injury. Your symptoms may resolve with or without further involvement from your physician, physical therapist or athletic trainer. While completing these exercises, remember:  Restoring tissue flexibility helps normal motion to return to the joints. This allows healthier, less painful movement and activity. An effective stretch should be held for at least 30 seconds. A stretch should never be painful. You should only feel a gentle lengthening or release in the stretched tissue. FLEXION RANGE OF MOTION AND STRETCHING EXERCISES:  STRETCH - Flexion, Single Knee to Chest  Lie on a firm bed or floor with both legs extended in  front of you. Keeping one leg in contact with the floor, bring your opposite knee to your chest. Hold your leg in place by either grabbing behind your thigh or at your knee. Pull until you feel a gentle stretch in your low back. Hold 30 seconds. Slowly release your grasp and repeat the exercise with the opposite side. Repeat 2 times. Complete this exercise 3 times per week.   STRETCH - Flexion, Double Knee to Chest Lie on a firm bed or floor with both legs extended in front of you. Keeping one leg in contact with the floor, bring your opposite knee to your chest. Tense your stomach muscles to support your back and then lift your other knee to your chest. Hold your legs in place by either grabbing behind your thighs or at your knees. Pull both knees toward your chest until you feel a gentle stretch in your low back. Hold 30 seconds. Tense your stomach muscles and slowly return one leg at a time to the floor. Repeat 2 times. Complete this exercise 3 times per week.   STRETCH - Low Trunk Rotation Lie on a firm bed or floor. Keeping your legs in front of you, bend your knees so they are both pointed toward the ceiling and your feet are flat on the floor. Extend your arms out to the side. This will stabilize your upper body by keeping your shoulders in contact with the floor. Gently and slowly drop both knees together to one side until you feel a gentle stretch in your low back.  Hold for 30 seconds. Tense your stomach muscles to support your lower back as you bring your knees back to the starting position. Repeat the exercise to the other side. Repeat 2 times. Complete this exercise at least 3 times per week.   EXTENSION RANGE OF MOTION AND FLEXIBILITY EXERCISES:  STRETCH - Extension, Prone on Elbows  Lie on your stomach on the floor, a bed will be too soft. Place your palms about shoulder width apart and at the height of your head. Place your elbows under your shoulders. If this is too painful,  stack pillows under your chest. Allow your body to relax so that your hips drop lower and make contact more completely with the floor. Hold this position for 30 seconds. Slowly return to lying flat on the floor. Repeat 2 times. Complete this exercise 3 times per week.   RANGE OF MOTION - Extension, Prone Press Ups Lie on your stomach on the floor, a bed will be too soft. Place your palms about shoulder width apart and at the height of your head. Keeping your back as relaxed as possible, slowly straighten your elbows while keeping your hips on the floor. You may adjust the placement of your hands to maximize your comfort. As you gain motion, your hands will come more underneath your shoulders. Hold this position 30 seconds. Slowly return to lying flat on the floor. Repeat 2 times. Complete this exercise 3 times per week.   RANGE OF MOTION- Quadruped, Neutral Spine  Assume a hands and knees position on a firm surface. Keep your hands under your shoulders and your knees under your hips. You may place padding under your knees for comfort. Drop your head and point your tailbone toward the ground below you. This will round out your lower back like an angry cat. Hold this position for 30 seconds. Slowly lift your head and release your tail bone so that your back sags into a large arch, like an old horse. Hold this position for 30 seconds. Repeat this until you feel limber in your low back. Now, find your "sweet spot." This will be the most comfortable position somewhere between the two previous positions. This is your neutral spine. Once you have found this position, tense your stomach muscles to support your low back. Hold this position for 30 seconds. Repeat 2 times. Complete this exercise 3 times per week.   STRENGTHENING EXERCISES - Low Back Sprain These exercises may help you when beginning to rehabilitate your injury. These exercises should be done near your "sweet spot." This is the neutral,  low-back arch, somewhere between fully rounded and fully arched, that is your least painful position. When performed in this safe range of motion, these exercises can be used for people who have either a flexion or extension based injury. These exercises may resolve your symptoms with or without further involvement from your physician, physical therapist or athletic trainer. While completing these exercises, remember:  Muscles can gain both the endurance and the strength needed for everyday activities through controlled exercises. Complete these exercises as instructed by your physician, physical therapist or athletic trainer. Increase the resistance and repetitions only as guided. You may experience muscle soreness or fatigue, but the pain or discomfort you are trying to eliminate should never worsen during these exercises. If this pain does worsen, stop and make certain you are following the directions exactly. If the pain is still present after adjustments, discontinue the exercise until you can discuss the trouble with your  caregiver.  STRENGTHENING - Deep Abdominals, Pelvic Tilt  Lie on a firm bed or floor. Keeping your legs in front of you, bend your knees so they are both pointed toward the ceiling and your feet are flat on the floor. Tense your lower abdominal muscles to press your low back into the floor. This motion will rotate your pelvis so that your tail bone is scooping upwards rather than pointing at your feet or into the floor. With a gentle tension and even breathing, hold this position for 3 seconds. Repeat 2 times. Complete this exercise 3 times per week.   STRENGTHENING - Abdominals, Crunches  Lie on a firm bed or floor. Keeping your legs in front of you, bend your knees so they are both pointed toward the ceiling and your feet are flat on the floor. Cross your arms over your chest. Slightly tip your chin down without bending your neck. Tense your abdominals and slowly lift your  trunk high enough to just clear your shoulder blades. Lifting higher can put excessive stress on the lower back and does not further strengthen your abdominal muscles. Control your return to the starting position. Repeat 2 times. Complete this exercise 3 times per week.   STRENGTHENING - Quadruped, Opposite UE/LE Lift  Assume a hands and knees position on a firm surface. Keep your hands under your shoulders and your knees under your hips. You may place padding under your knees for comfort. Find your neutral spine and gently tense your abdominal muscles so that you can maintain this position. Your shoulders and hips should form a rectangle that is parallel with the floor and is not twisted. Keeping your trunk steady, lift your right hand no higher than your shoulder and then your left leg no higher than your hip. Make sure you are not holding your breath. Hold this position for 30 seconds. Continuing to keep your abdominal muscles tense and your back steady, slowly return to your starting position. Repeat with the opposite arm and leg. Repeat 2 times. Complete this exercise 3 times per week.   STRENGTHENING - Abdominals and Quadriceps, Straight Leg Raise  Lie on a firm bed or floor with both legs extended in front of you. Keeping one leg in contact with the floor, bend the other knee so that your foot can rest flat on the floor. Find your neutral spine, and tense your abdominal muscles to maintain your spinal position throughout the exercise. Slowly lift your straight leg off the floor about 6 inches for a count of 3, making sure to not hold your breath. Still keeping your neutral spine, slowly lower your leg all the way to the floor. Repeat this exercise with each leg 2 times. Complete this exercise 3 times per week.  POSTURE AND BODY MECHANICS CONSIDERATIONS - Low Back Sprain Keeping correct posture when sitting, standing or completing your activities will reduce the stress put on different body  tissues, allowing injured tissues a chance to heal and limiting painful experiences. The following are general guidelines for improved posture.  While reading these guidelines, remember: The exercises prescribed by your provider will help you have the flexibility and strength to maintain correct postures. The correct posture provides the best environment for your joints to work. All of your joints have less wear and tear when properly supported by a spine with good posture. This means you will experience a healthier, less painful body. Correct posture must be practiced with all of your activities, especially prolonged sitting and  standing. Correct posture is as important when doing repetitive low-stress activities (typing) as it is when doing a single heavy-load activity (lifting).  RESTING POSITIONS Consider which positions are most painful for you when choosing a resting position. If you have pain with flexion-based activities (sitting, bending, stooping, squatting), choose a position that allows you to rest in a less flexed posture. You would want to avoid curling into a fetal position on your side. If your pain worsens with extension-based activities (prolonged standing, working overhead), avoid resting in an extended position such as sleeping on your stomach. Most people will find more comfort when they rest with their spine in a more neutral position, neither too rounded nor too arched. Lying on a non-sagging bed on your side with a pillow between your knees, or on your back with a pillow under your knees will often provide some relief. Keep in mind, being in any one position for a prolonged period of time, no matter how correct your posture, can still lead to stiffness.  PROPER SITTING POSTURE In order to minimize stress and discomfort on your spine, you must sit with correct posture. Sitting with good posture should be effortless for a healthy body. Returning to good posture is a gradual process.  Many people can work toward this most comfortably by using various supports until they have the flexibility and strength to maintain this posture on their own. When sitting with proper posture, your ears will fall over your shoulders and your shoulders will fall over your hips. You should use the back of the chair to support your upper back. Your lower back will be in a neutral position, just slightly arched. You may place a small pillow or folded towel at the base of your lower back for  support.  When working at a desk, create an environment that supports good, upright posture. Without extra support, muscles tire, which leads to excessive strain on joints and other tissues. Keep these recommendations in mind:  CHAIR: A chair should be able to slide under your desk when your back makes contact with the back of the chair. This allows you to work closely. The chair's height should allow your eyes to be level with the upper part of your monitor and your hands to be slightly lower than your elbows.  BODY POSITION Your feet should make contact with the floor. If this is not possible, use a foot rest. Keep your ears over your shoulders. This will reduce stress on your neck and low back.  INCORRECT SITTING POSTURES  If you are feeling tired and unable to assume a healthy sitting posture, do not slouch or slump. This puts excessive strain on your back tissues, causing more damage and pain. Healthier options include: Using more support, like a lumbar pillow. Switching tasks to something that requires you to be upright or walking. Talking a brief walk. Lying down to rest in a neutral-spine position.  PROLONGED STANDING WHILE SLIGHTLY LEANING FORWARD  When completing a task that requires you to lean forward while standing in one place for a long time, place either foot up on a stationary 2-4 inch high object to help maintain the best posture. When both feet are on the ground, the lower back tends to lose  its slight inward curve. If this curve flattens (or becomes too large), then the back and your other joints will experience too much stress, tire more quickly, and can cause pain.  CORRECT STANDING POSTURES Proper standing posture should be assumed  with all daily activities, even if they only take a few moments, like when brushing your teeth. As in sitting, your ears should fall over your shoulders and your shoulders should fall over your hips. You should keep a slight tension in your abdominal muscles to brace your spine. Your tailbone should point down to the ground, not behind your body, resulting in an over-extended swayback posture.   INCORRECT STANDING POSTURES  Common incorrect standing postures include a forward head, locked knees and/or an excessive swayback. WALKING Walk with an upright posture. Your ears, shoulders and hips should all line-up.  PROLONGED ACTIVITY IN A FLEXED POSITION When completing a task that requires you to bend forward at your waist or lean over a low surface, try to find a way to stabilize 3 out of 4 of your limbs. You can place a hand or elbow on your thigh or rest a knee on the surface you are reaching across. This will provide you more stability, so that your muscles do not tire as quickly. By keeping your knees relaxed, or slightly bent, you will also reduce stress across your lower back. CORRECT LIFTING TECHNIQUES  DO : Assume a wide stance. This will provide you more stability and the opportunity to get as close as possible to the object which you are lifting. Tense your abdominals to brace your spine. Bend at the knees and hips. Keeping your back locked in a neutral-spine position, lift using your leg muscles. Lift with your legs, keeping your back straight. Test the weight of unknown objects before attempting to lift them. Try to keep your elbows locked down at your sides in order get the best strength from your shoulders when carrying an object.   Always  ask for help when lifting heavy or awkward objects. INCORRECT LIFTING TECHNIQUES DO NOT:  Lock your knees when lifting, even if it is a small object. Bend and twist. Pivot at your feet or move your feet when needing to change directions. Assume that you can safely pick up even a paperclip without proper posture.

## 2021-01-22 ENCOUNTER — Other Ambulatory Visit: Payer: Self-pay | Admitting: Family Medicine

## 2021-01-22 DIAGNOSIS — G8929 Other chronic pain: Secondary | ICD-10-CM

## 2021-01-22 MED ORDER — MELOXICAM 15 MG PO TABS: 15.0000 mg | ORAL_TABLET | Freq: Every day | ORAL | 0 refills | Status: DC

## 2021-01-23 ENCOUNTER — Other Ambulatory Visit: Payer: Self-pay | Admitting: Family Medicine

## 2021-01-23 DIAGNOSIS — G8929 Other chronic pain: Secondary | ICD-10-CM

## 2021-02-25 ENCOUNTER — Other Ambulatory Visit: Payer: Self-pay | Admitting: Family Medicine

## 2021-02-25 DIAGNOSIS — G8929 Other chronic pain: Secondary | ICD-10-CM

## 2021-02-25 DIAGNOSIS — M5441 Lumbago with sciatica, right side: Secondary | ICD-10-CM

## 2021-03-25 ENCOUNTER — Other Ambulatory Visit: Payer: Self-pay | Admitting: Family Medicine

## 2021-03-25 DIAGNOSIS — G8929 Other chronic pain: Secondary | ICD-10-CM

## 2021-03-25 DIAGNOSIS — M5441 Lumbago with sciatica, right side: Secondary | ICD-10-CM

## 2021-04-23 ENCOUNTER — Other Ambulatory Visit: Payer: Self-pay | Admitting: Family Medicine

## 2021-04-23 DIAGNOSIS — G8929 Other chronic pain: Secondary | ICD-10-CM

## 2021-05-20 ENCOUNTER — Other Ambulatory Visit: Payer: Self-pay | Admitting: Family Medicine

## 2021-05-20 DIAGNOSIS — G8929 Other chronic pain: Secondary | ICD-10-CM

## 2021-06-18 ENCOUNTER — Other Ambulatory Visit: Payer: Self-pay | Admitting: Family Medicine

## 2021-06-18 DIAGNOSIS — G8929 Other chronic pain: Secondary | ICD-10-CM

## 2021-07-22 ENCOUNTER — Other Ambulatory Visit: Payer: Self-pay | Admitting: Family Medicine

## 2021-07-22 DIAGNOSIS — G8929 Other chronic pain: Secondary | ICD-10-CM

## 2021-08-31 ENCOUNTER — Other Ambulatory Visit: Payer: Self-pay | Admitting: Family Medicine

## 2021-08-31 DIAGNOSIS — G8929 Other chronic pain: Secondary | ICD-10-CM

## 2021-09-26 ENCOUNTER — Other Ambulatory Visit: Payer: Self-pay | Admitting: Family Medicine

## 2021-09-26 DIAGNOSIS — G8929 Other chronic pain: Secondary | ICD-10-CM

## 2021-10-31 ENCOUNTER — Other Ambulatory Visit: Payer: Self-pay | Admitting: Family Medicine

## 2021-10-31 DIAGNOSIS — G8929 Other chronic pain: Secondary | ICD-10-CM

## 2021-12-16 ENCOUNTER — Other Ambulatory Visit: Payer: Self-pay | Admitting: Family Medicine

## 2021-12-16 DIAGNOSIS — G8929 Other chronic pain: Secondary | ICD-10-CM

## 2022-01-23 ENCOUNTER — Other Ambulatory Visit: Payer: Self-pay | Admitting: Family Medicine

## 2022-01-23 DIAGNOSIS — G8929 Other chronic pain: Secondary | ICD-10-CM

## 2022-02-26 ENCOUNTER — Other Ambulatory Visit: Payer: Self-pay | Admitting: Family Medicine

## 2022-02-26 DIAGNOSIS — G8929 Other chronic pain: Secondary | ICD-10-CM

## 2022-03-29 ENCOUNTER — Other Ambulatory Visit: Payer: Self-pay | Admitting: Family Medicine

## 2022-03-29 DIAGNOSIS — G8929 Other chronic pain: Secondary | ICD-10-CM

## 2022-03-29 NOTE — Telephone Encounter (Signed)
Last OV --- 12/25/20---Needs appointment for this refill. No upcoming appointment. Called the patient no answer///no VM to leave message.

## 2022-04-13 ENCOUNTER — Other Ambulatory Visit: Payer: Self-pay | Admitting: Family Medicine

## 2022-04-13 DIAGNOSIS — G8929 Other chronic pain: Secondary | ICD-10-CM

## 2022-04-13 NOTE — Telephone Encounter (Signed)
Last OV with PCP on 12/25/2020 Needs an appt for more refills. Called left messge to schedule appt with PCP.
